# Patient Record
Sex: Male | Born: 1960 | Race: Black or African American | Hispanic: No | Marital: Married | Smoking: Never smoker
Health system: Southern US, Community
[De-identification: ages and names within clinical notes are randomized; demographics above are authoritative.]

## PROBLEM LIST (undated history)

## (undated) DIAGNOSIS — C801 Malignant (primary) neoplasm, unspecified: Secondary | ICD-10-CM

## (undated) DIAGNOSIS — E039 Hypothyroidism, unspecified: Secondary | ICD-10-CM

## (undated) DIAGNOSIS — E119 Type 2 diabetes mellitus without complications: Secondary | ICD-10-CM

## (undated) DIAGNOSIS — I10 Essential (primary) hypertension: Secondary | ICD-10-CM

## (undated) HISTORY — PX: TOTAL THYROIDECTOMY: SHX2547

---

## 2008-03-15 ENCOUNTER — Ambulatory Visit: Payer: Self-pay | Admitting: Vascular Surgery

## 2008-03-15 ENCOUNTER — Inpatient Hospital Stay (HOSPITAL_COMMUNITY): Admission: EM | Admit: 2008-03-15 | Discharge: 2008-03-16 | Payer: Self-pay | Admitting: Emergency Medicine

## 2008-03-15 ENCOUNTER — Encounter (INDEPENDENT_AMBULATORY_CARE_PROVIDER_SITE_OTHER): Payer: Self-pay | Admitting: Internal Medicine

## 2008-03-15 ENCOUNTER — Ambulatory Visit: Payer: Self-pay | Admitting: Cardiology

## 2008-04-18 ENCOUNTER — Encounter (INDEPENDENT_AMBULATORY_CARE_PROVIDER_SITE_OTHER): Payer: Self-pay | Admitting: Interventional Radiology

## 2008-04-18 ENCOUNTER — Other Ambulatory Visit: Admission: RE | Admit: 2008-04-18 | Discharge: 2008-04-18 | Payer: Self-pay | Admitting: Interventional Radiology

## 2008-04-18 ENCOUNTER — Encounter: Admission: RE | Admit: 2008-04-18 | Discharge: 2008-04-18 | Payer: Self-pay | Admitting: *Deleted

## 2008-05-14 IMAGING — US US SOFT TISSUE HEAD/NECK
1 series · 13 of 25 positions shown · non-contrast
Comparison: None

CLINICAL DATA: Evaluate goiter.

THYROID ULTRASOUND
TECHNIQUE: Ultrasound examination of the thyroid gland and adjacent
soft tissues was performed.

[Series 1: unknown · 0.12mm/px · 13 of 35 slices shown]
[im 1/35]
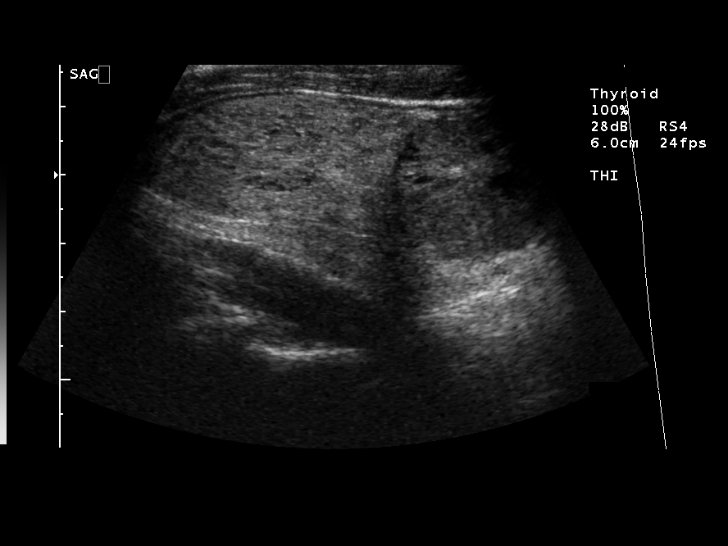
[im 3/35]
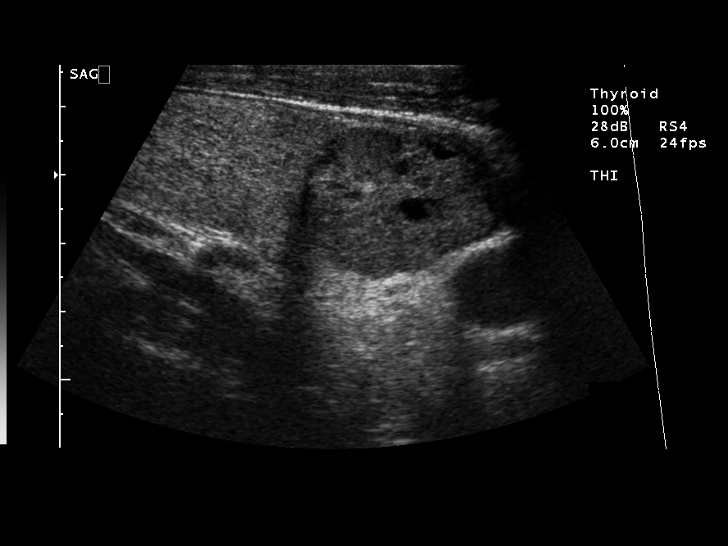
[im 6/35]
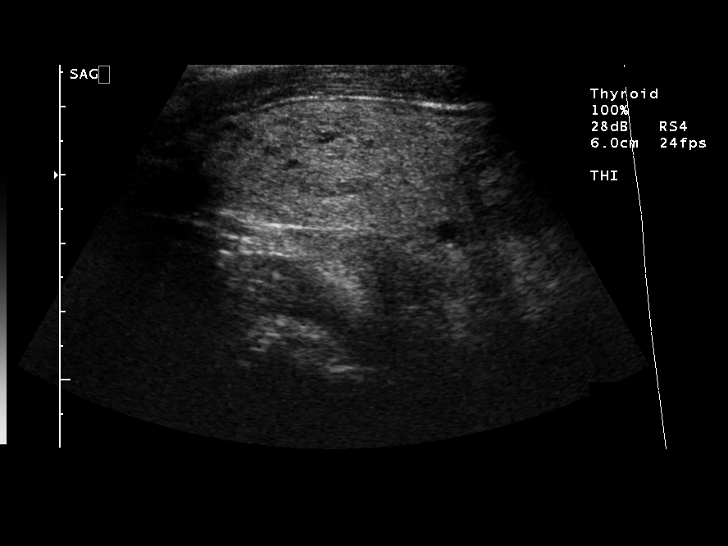
[im 9/35]
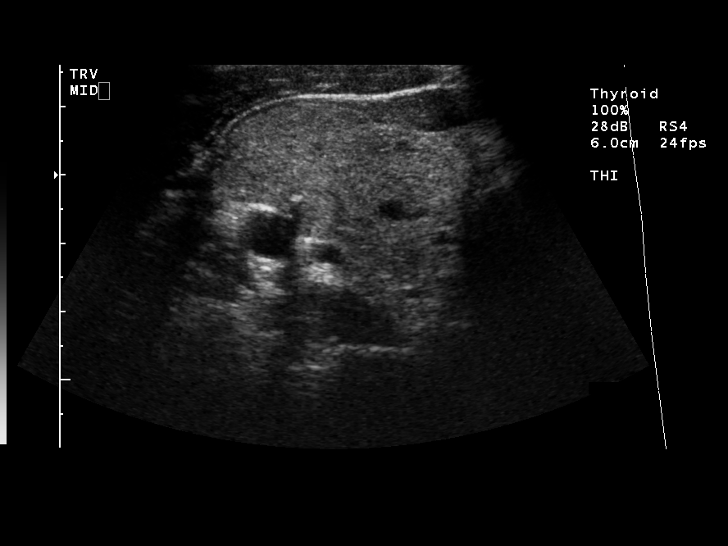
[im 12/35]
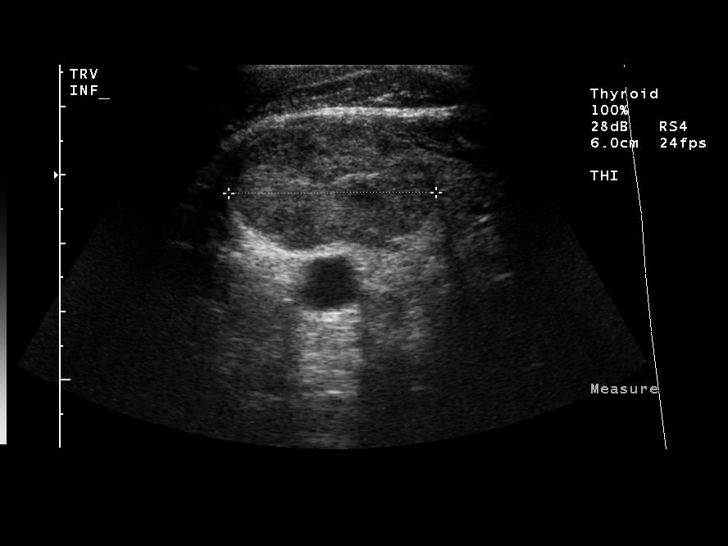
[im 15/35]
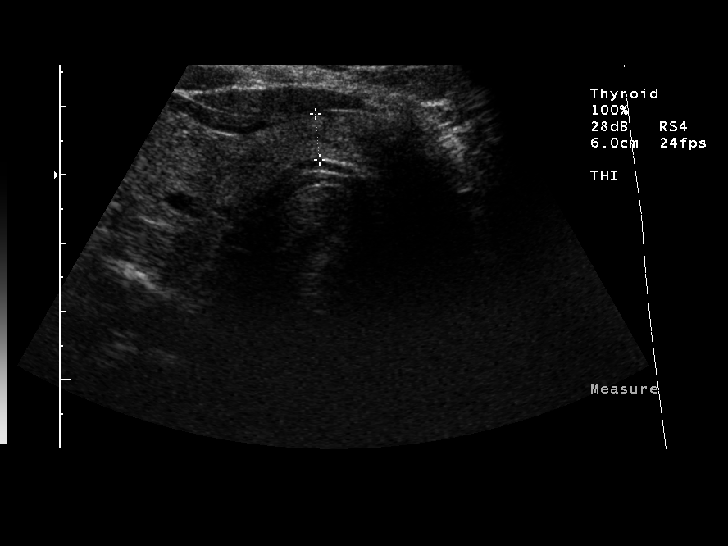
[im 18/35]
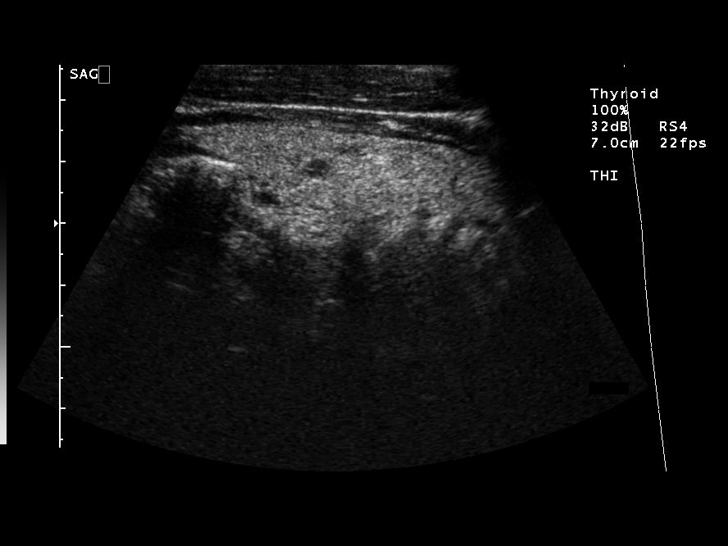
[im 20/35]
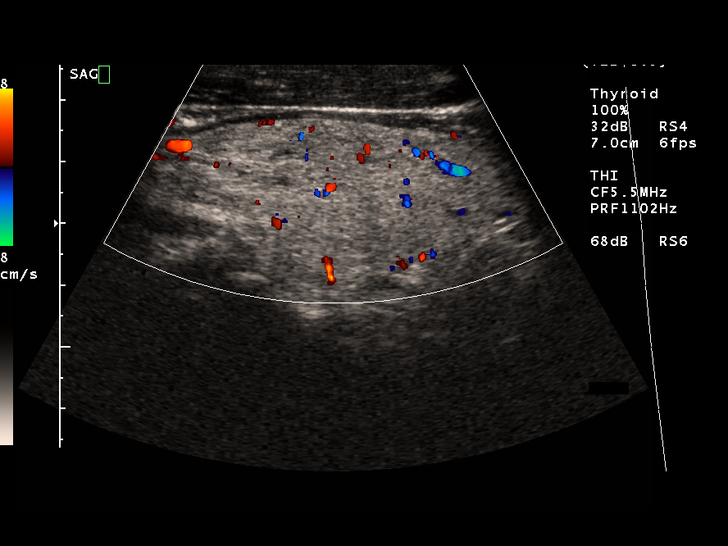
[im 23/35]
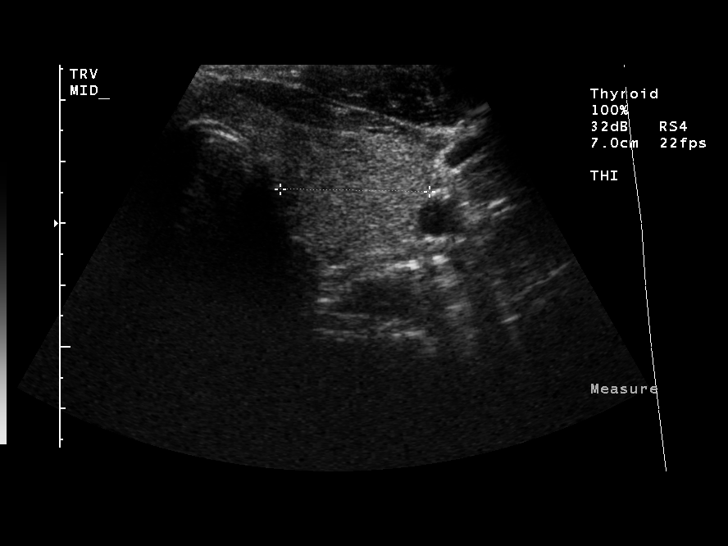
[im 26/35]
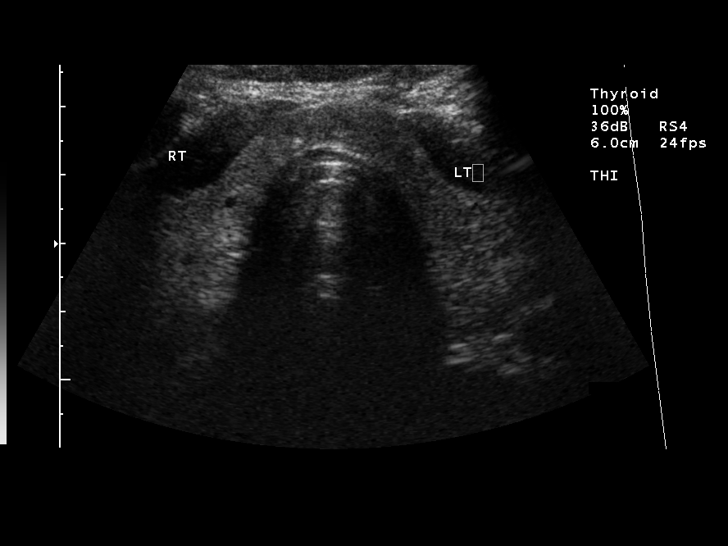
[im 29/35]
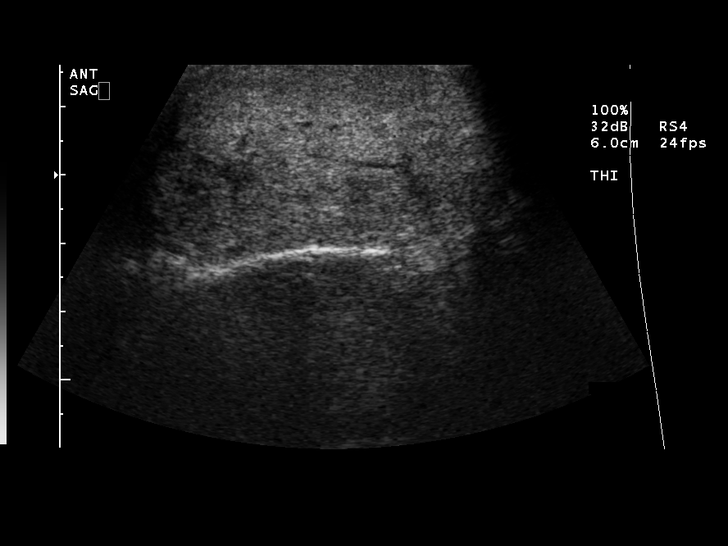
[im 32/35]
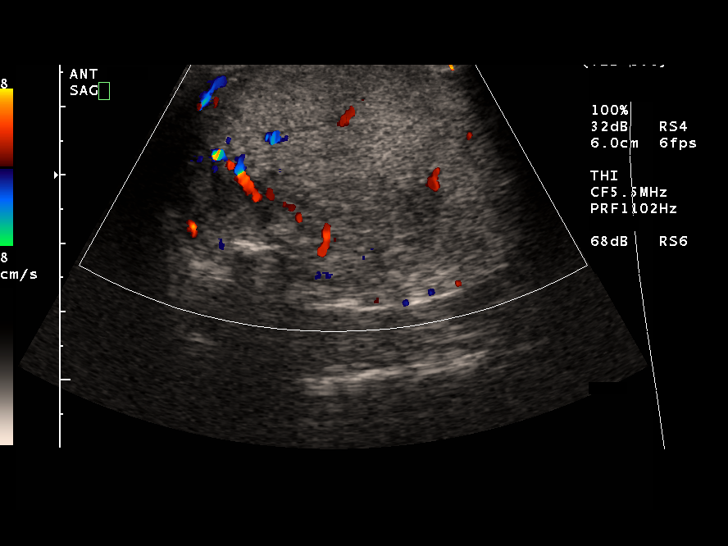
[im 35/35]
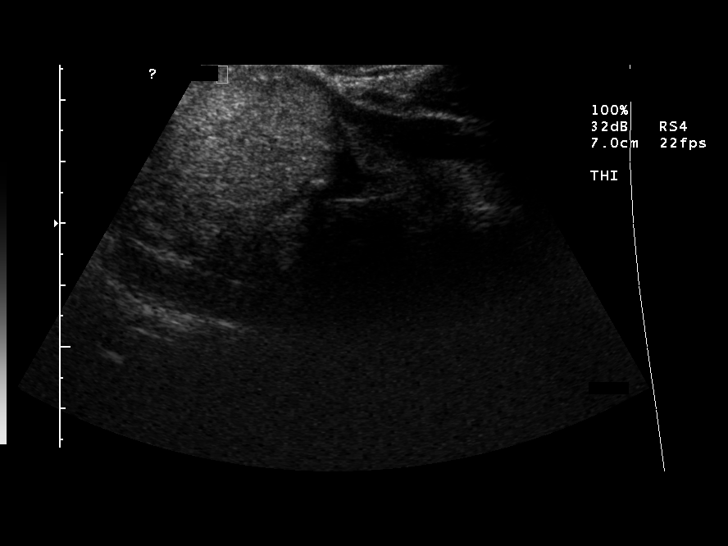

[13 of 25 positions shown; findings below may reference images not displayed]

FINDINGS: The right lobe of the thyroid measured 6.4 x 3.0 x
cm.

The left lobe of the thyroid measured 5.8 x 2.5 x 2.4 cm.

The AP diameter of the isthmus is 0.7 cm.

In the lower pole of the right lobe of the thyroid a predominately
solid dominant mass is seen.  The mass measures 3.0 x 2.2 x 3.0 cm
in diameter. There are some small cystic areas within it.  There is
some internal color Doppler flow]

Next to the superior aspect of the thyroid gland at the junction of
the isthmus and left lobe is a rounded mass density measuring 5.6 x
2.7 x 4.5 cm in diameter with a possible stalk.  This cannot be
separated from the thyroid gland.  I am unsure if this is a mass
arising from the thyroid gland or is a superior extension of the
thyroid gland or a mass contiguous with it.
IMPRESSION: : Mild diffuse enlargement of the right and left lobes of the
thyroid gland consistent with mild thyromegaly.

Predominately solid mass of lower pole right lobe of thyroid with a
few small cystic areas within it..  This is a predominantly solid
mass.  Considering its size ultrasound guided fine needle
aspiration would be suggested to be considered.

The other mass or solid area next to the superior pole of the left
lobe of the thyroid as discussed above.  Biopsies this could also
be performed considered in size.  Further imaging with MRI could be
performed for additional characterization..

## 2008-06-07 ENCOUNTER — Ambulatory Visit (HOSPITAL_COMMUNITY): Admission: RE | Admit: 2008-06-07 | Discharge: 2008-06-08 | Payer: Self-pay | Admitting: Surgery

## 2008-06-07 ENCOUNTER — Encounter (INDEPENDENT_AMBULATORY_CARE_PROVIDER_SITE_OTHER): Payer: Self-pay | Admitting: Surgery

## 2008-06-16 IMAGING — US US BIOPSY
1 series · 13 of 19 positions shown · non-contrast
Comparison: none

CLINICAL DATA: Patient with history of goiter and thyroid
ultrasound at [HOSPITAL] on 03/16/2008 which revealed two
dominant nodules. In the right lower pole there is a predominantly
solid nodule measuring 3.0 x 2.2 x 3.0 cm. In addition , next to
the superior aspect of the thyroid gland at the junction of the
isthmus and left lobe is a nodule measuring 5.6 x 2.7 x 4.5 cm.
Request is now made for needle aspirate biopsies of the above-
mentioned nodules.

ULTRASOUND-GUIDED NEEDLE ASPIRATE BIOPSY, RIGHT LOWER POLE THYROID
NODULE
The above procedures were discussed with the patient and written
informed consent was obtained.

[Series 1: us biopsy · 0.08mm/px · 19 acquisitions, 13 frames shown]
[im 1/19]
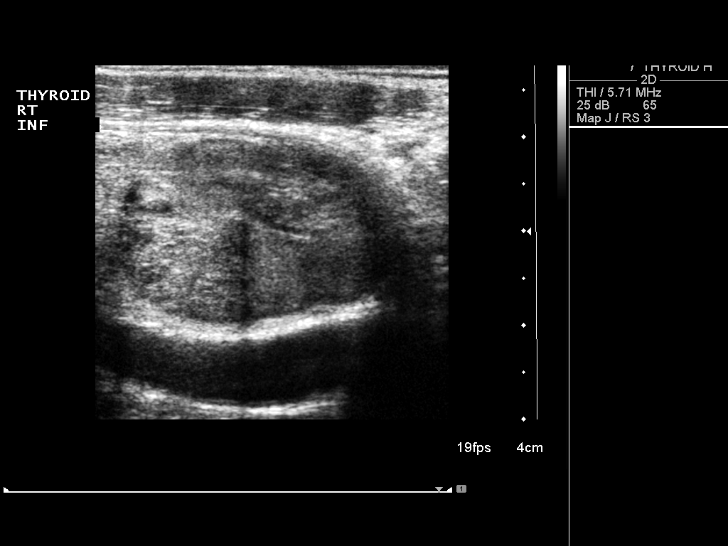
[im 3/19]
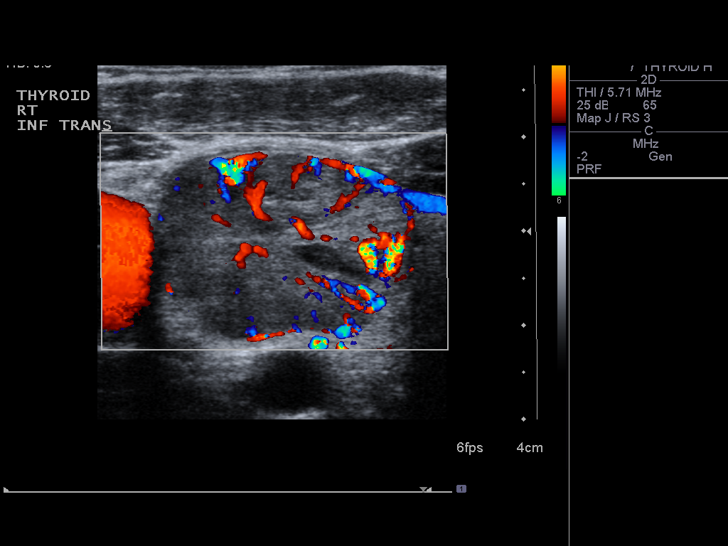
[im 4/19]
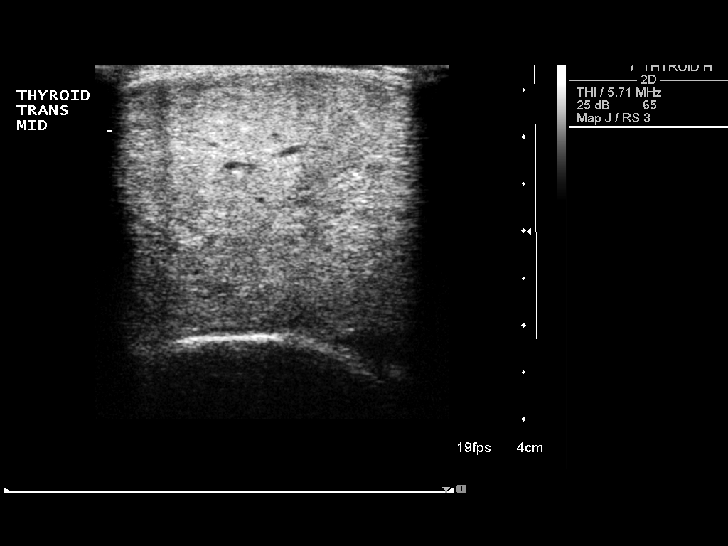
[im 6/19]
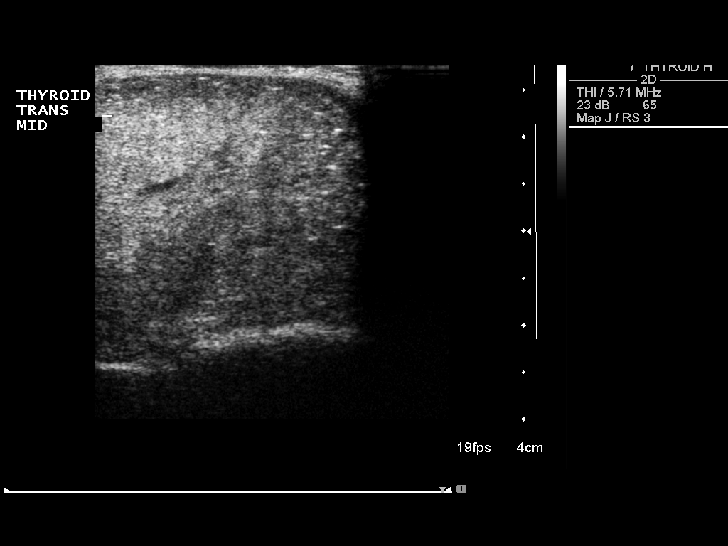
[im 7/19]
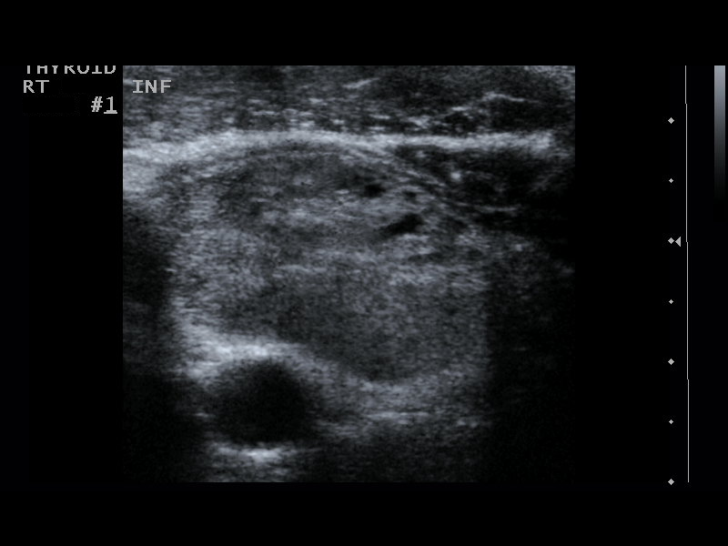
[im 9/19]
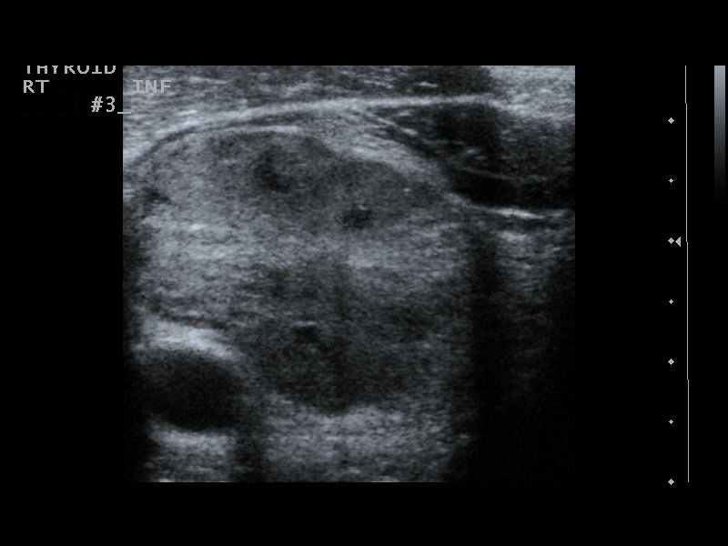
[im 10/19]
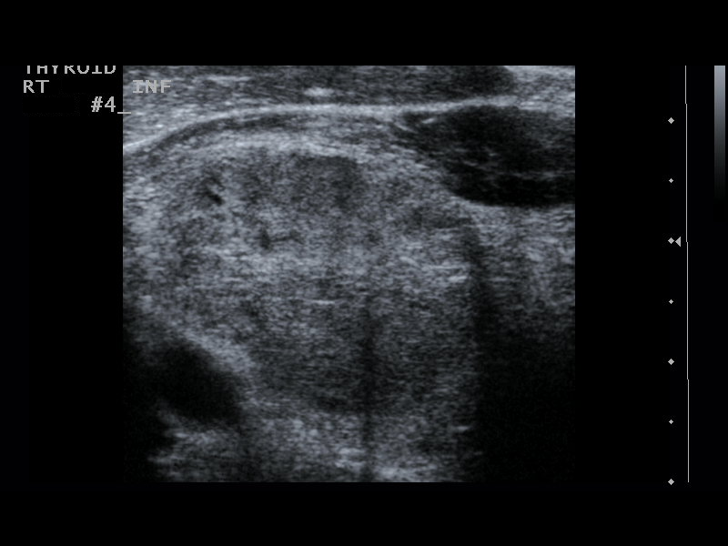
[im 11/19]
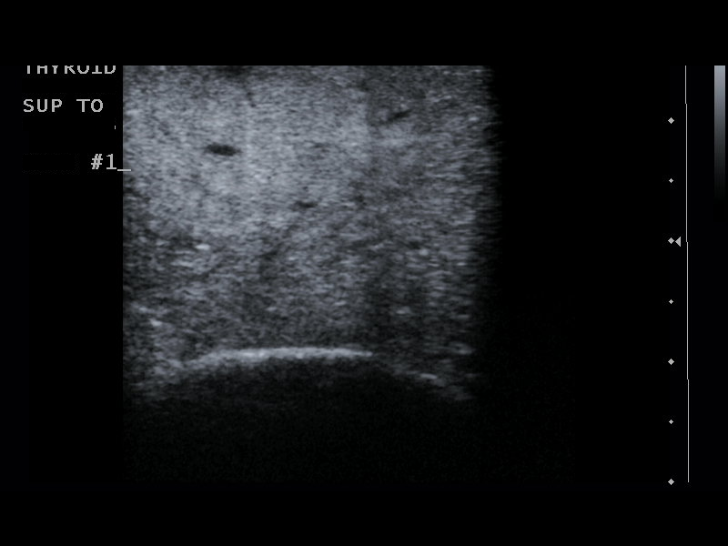
[im 13/19]
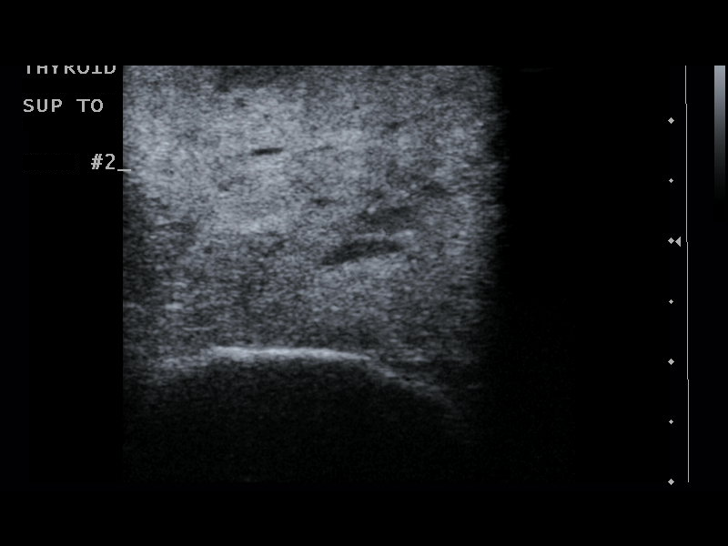
[im 14/19]
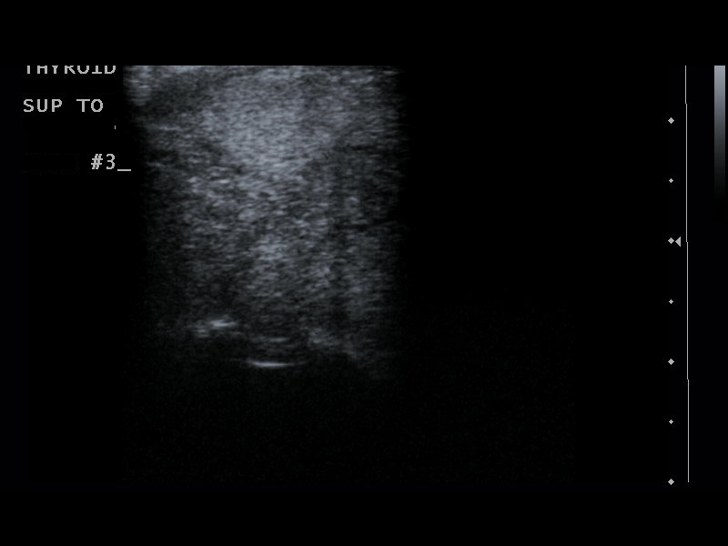
[im 16/19]
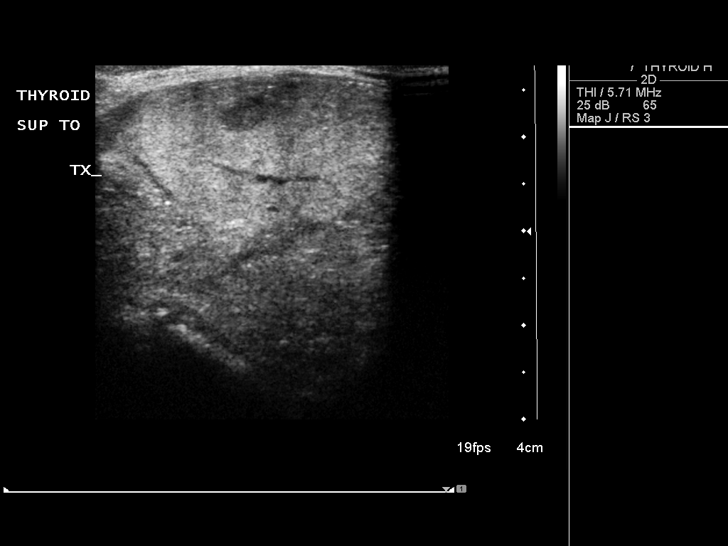
[im 17/19]
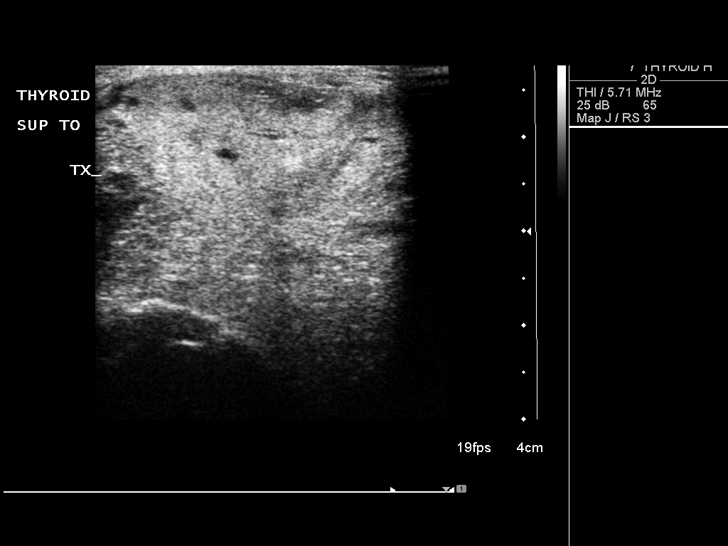
[im 19/19]
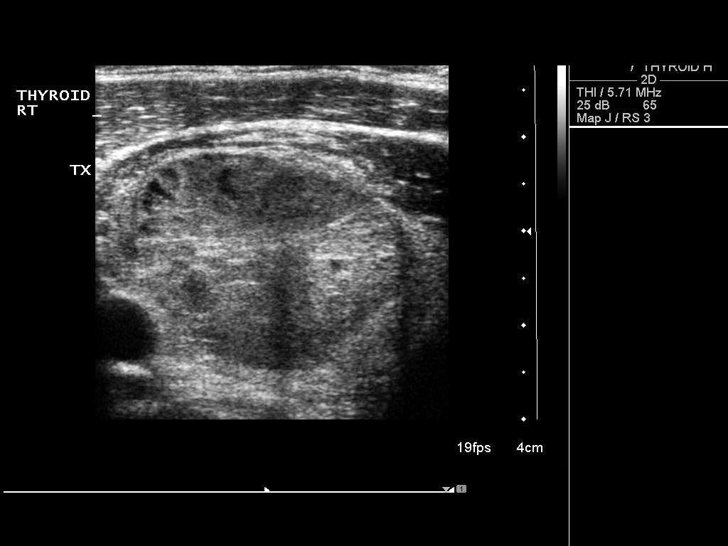

[13 of 19 positions shown; findings below may reference images not displayed]

FINDINGS: Ultrasound was performed to localize and mark an adequate
site for the biopsy. The patient was then prepped and draped in a
normal sterile fashion. Local anesthesia was provided with 1%
lidocaine. Under direct ultrasound guidance, 4 passes were made
using 25 gauge needles into the nodule within the right lower pole
of the thyroid. Ultrasound was used to confirm needle placements
on all four occasions. Specimens were sent to Pathology for
analysis. Post procedural imaging demonstrated no hematoma or
immediate complication. The patient tolerated the procedure well.

ULTRASOUND GUIDED NEEDLE ASPIRATE BIOPSY, SUPERIOR LEFT
THYROID/ISTHMUS JUNCTION NODULE
FINDINGS: Ultrasound was then performed to localize and mark an
adequate site for the biopsy. The patient was then prepped and
draped in a normal sterile fashion. 1% lidocaine was used for
local anesthesia. Under direct ultrasound guidance , 4 passes were
made using 22 and 25 gauge needles into the nodule located within
the superior aspect of the left thyroid/isthmus junction.
Ultrasound confirmed placement of the needle on all four occasions.
The specimens were sent to pathology for analysis. Postprocedural
imaging demonstrated no hematoma or immediate complication. The
patient tolerated procedure well.
IMPRESSION: Successful ultrasound guided needle aspirate biopsies
of a right lower pole and superior left thyroid/isthmus junction
nodules as discussed above. Final pathology pending.

Read by: Martiins, Delfina Da Silva.-MARTIN ALEXIS

## 2008-07-21 ENCOUNTER — Encounter (HOSPITAL_COMMUNITY): Admission: RE | Admit: 2008-07-21 | Discharge: 2008-10-18 | Payer: Self-pay | Admitting: Endocrinology

## 2008-09-25 IMAGING — NM NM RAI THERAPY CANCER THYROID
1 series · 1 of 1 positions shown · non-contrast
Comparison: None

CLINICAL DATA: New thyroid cancer

NUCLEAR MEDICINE RADIOACTIVE IODINE THERAPY FOR CA
TECHNIQUE: The risks and benefits of radioactive iodine therapy
were discussed with the patient in detail. Alternative therapies
were also mentioned. Radiation safety was discussed with the
patient, including how to protect the general public from exposure.
There were no barriers to communication.  Written consent was
obtained.  The patient then received a capsule containing the
radiopharmaceutical.  The patient will follow-up with the referring
physician.
Radiopharmaceutical: 106.4 mCi I131

[Series 1: st statics,dual det. · 1 of 1 slices shown]
[im 1/1]
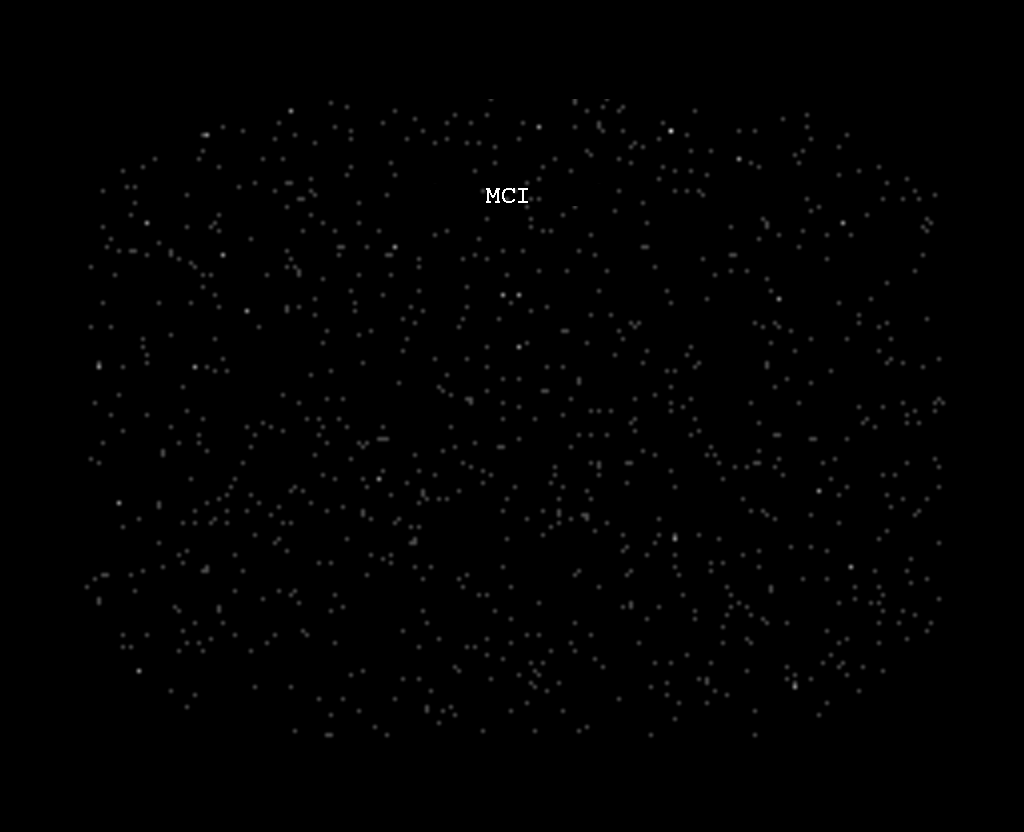

[1 of 1 positions shown; findings below may reference images not displayed]

FINDINGS: No images are taken.
IMPRESSION: The patient had received the capsule containing the
radiopharmaceutical.  The patient will follow up with referring
physician.

## 2010-12-02 ENCOUNTER — Encounter: Payer: Self-pay | Admitting: Endocrinology

## 2011-03-25 NOTE — H&P (Signed)
NAME:  Colton Gibson, Colton Gibson                ACCOUNT NO.:  [id number]   MEDICAL RECORD NO.:  [id number]          PATIENT TYPE:  INP   LOCATION:  4715                         FACILITY:  [facility]   PHYSICIAN:  Hind I Elsaid, MD      DATE OF BIRTH:  March 09, 1961   DATE OF ADMISSION:  03/15/2008  DATE OF DISCHARGE:                              HISTORY & PHYSICAL   CHIEF COMPLAINT:  Chest pain.   HISTORY OF PRESENT ILLNESS:  This is a 50 year old African American male  with a history of hypertension, hyperlipidemia admitted to the hospital  with a chief complaint of chest discomfort, mainly heaviness at the  midsternal area.  The patient went with his regular time at 12:30 a.m.  He woke up with chest discomfort, denies any shortness of breath or  nausea and he felt nauseated at that time.  Denies any vomiting.  Pain  radiated to his left hand and he complained of left hand numbness when  he said he woke up, but the left hand numbness soon gone.  The patient's  chest discomfort is relieved with baby aspirin, but he still has some  kind of pain on his midsternal area.  The patient admitted no similar  problem in the past.   PAST MEDICAL HISTORY:  1. Hypertension.  2. Hypercholesterolemia.   ALLERGIES:  NO KNOWN DRUG ALLERGIES.   PAST SURGICAL HISTORY:  None.   FAMILY HISTORY:  His father died at age 68 secondary to heart attack.  His mother is still alive, has hypertension.   SOCIAL HISTORY:  He is married.  Works as Chartered loss adjuster.  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  Denies  smoking.  Denies any IV drug abuse.  Denies any alcohol abuse.   MEDICATIONS:  Toprol 100 mg p.o. daily.   PHYSICAL EXAMINATION:  VITAL SIGNS:  Temperature 97.9, blood pressure  149/93, pulse rate 72, respiratory rate 20, and satting 97% on room air.  HEENT:  Normocephalic, atraumatic.  Pupils are equal and reactive to  light and accommodation.  Extraocular muscle movement was normal.  NECK:  Supple.  No JVD.  No lymphadenopathy.  LUNG:  Normal vesicular breathing with equal air entry.  ABDOMEN:  Soft, nontender.  Bowel sounds positive.  EXTREMITIES:  There is no lower limb edema.  Peripheral pulses are  intact.  CNS:  The patient is alert and oriented x3, without any focal  neurological finding.   LABORATORY DATA:  Blood work up:  Sodium 15, hemoglobin 16.6, hematocrit  46.  Sodium 41, potassium 3.9, chloride at 105, glucose 90, BUN 13,  creatinine 1.  Cardiac markers, CK-MB 11.7, myoglobin 221, and troponin  0.005.  Chest x-ray is pending.  EKG, normal sinus rhythm with a heart  rate around 61 with possible left atrial enlargement.   ASSESSMENT AND PLAN:  1. Chest pain to rule out myocardial infarction, rule out pulmonary      embolism, which is unlikely.  We will get D-dimmer, serial cardiac      enzymes, and a 2D echo.  We will place the patient on aspirin and      beta-blocker.  We will get lipid profile.  2. Hypertension.  We will continue with Toprol.  3. Deep venous thrombosis and gastrointestinal prophylaxis.  Further      recommendation to be addressed as hospital course progress.      Hind Bosie Helper, MD  Electronically Signed     HIE/MEDQ  D:  03/15/2008  T:  03/15/2008  Job:  [id number]

## 2011-03-25 NOTE — Op Note (Signed)
NAME:  Colton Gibson, Colton Gibson                ACCOUNT NO.:  [id number]   MEDICAL RECORD NO.:  [id number]          PATIENT TYPE:  AMB   LOCATION:  DAY                          FACILITY:  [facility]   PHYSICIAN:  Ardeth Sportsman, MD     DATE OF BIRTH:  1961-05-05   DATE OF PROCEDURE:  DATE OF DISCHARGE:                               OPERATIVE REPORT   PRIMARY CARE PHYSICIAN:  Gabriel Earing, M.D.   REQUESTING PHYSICIAN:  Rosine Abe, M.D.   SURGEON:  Ardeth Sportsman, M.D.   ASSISTANT:  Almond Lint, M.D.   PREOPERATIVE DIAGNOSIS:  1. Right upper pole thyroid nodule, FNA biopsy consistent with      follicular neoplasm.  2. Left isthmus thyroid nodule, FNA biopsy consistent with follicular      neoplasm.   POSTOPERATIVE DIAGNOSIS:  1. Right upper lobe thyroid mass, preoperative FNA consistent with      follicular neoplasm.  2. 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   PROCEDURE PERFORMED:  Total thyroidectomy with excision of large  probable pyramidal lobe.   ANESTHESIA:  1. General anesthesia.  2. Local anesthetic with field block around all port sites.   ESTIMATED BLOOD LOSS:  150 ml.   COMPLICATIONS:  No major complications.   INDICATIONS:  Mr. Hendriks is a 51 year old male who has had an engorging  neck fullness over the past few years.  He underwent ultrasound and was  found to have nodules in his right upper lobe and at the left side of  his isthmus as well.  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  Because these masses looked larger than 2 cm, (right greater  than 3 cm, and isthmus greater than 5 cm), I recommended that these be  excised.  Risks, benefits and alternatives were discussed.  Technique of  total thyroidectomy was discussed.  The risks of injury to the organs  and nerve injury and other risks were discussed in detail.  Questions  answered, agreed to proceed.   OPERATIVE FINDINGS:  He had a very large midline structure superior to  the isthmus of his thyroid, most likely consistent with an enlarged  pyramidal lobe.  This is most consistent with a lesion that was called  by ultrasound a lesion off the left isthmus.  His right upper pole  nodule was within the thyroid itself.  There was no other aberrant  anatomy.   DESCRIPTION OF PROCEDURE:  Informed consent was confirmed.  The patient  received preoperative antibiotics just prior to surgery.  He voided just  prior to going to the operating room.  He underwent general anesthesia  without any difficulty.  He was positioned supine with a shoulder roll  and gentle neck extension.  His chin to nipples were prepped and draped  in a sterile fashion.  A 6 cm curvilinear transverse incision was made  two fingerbreadths above the clavicular heads, centered around the  midline.  Later this was  extended around 7 cm total.  Cautery was used  to get through the platysma muscles without difficulty.  He had some  large anterior jugular veins, and these were able to be ligated and  dissected in the area.   We encountered a midline thyroid nodule.  After lifting up the platysmal  muscles and creating superior and inferior platysmal flaps, using  primary blunt as well as controlled cautery/dissection, was able to  encounter the flap muscles.  Inferiorly, the muscles had been pushed off  midline to the side secondary to the bulging of the central mass.  The  strap muscles are reflected laterally.  I was able to help free the  large midline structure off the strap muscles.  Initial concern this is  perhaps the enlarged isthmus within the thyroid.  We stayed closely  adherent to the mass and were able to free around it circumferentially  using controlled cautery.  Occasionally used clips and ties on the  larger blood vessels.  We used harmonic in most cases, especially as we  got  more posteriorly.  This mass is definitely adherent around the  inferior part of his very large thyroid cartilage.  We were superior to  his cricoid.  It kind of came to a point, barely attached to the  isthmus, most consistent with an enlarged pyrimidal lobe.  It was freed  off with a harmonic.  We sent the specimen off to Dr. Jimmy Picket with  pathology.  He felt on frozen evaluation that it was consistent with  thyroid tissue and possible follicular neoplasm, favoring adenoma but  obviously cannot make that full call on frozen alone.   Further inspection of the neck with the large mass revealed that his  thyroid glands were still noted.  His lobes were very posterior with a  wide, narrow isthmus.  We went ahead and did dissection on the left  side.  Meticulous dissection was done to free the thyroid gland off the  strap muscles all the way down the edge using primary blunt dissection  as well as some focused ultrasonic and cautery dissection.  We avoided  cautery around the posterior wall of the thyroid during the entire case.  In doing dissection, we were able to get around the superior pole of the  thyroid more easily.  We were able to free off the lateral part of the  superior lobe and follow it up until it came to a tip.  I was able to  get a window between the medial part of the superior portal of the  thyroid gland and the laryngeal cartilage and some cricoid.  This was  done primarily with some blunt dissection as well as focused harmonic as  well.  I eventually came to the tip of the superior pole and gradually  ligated the superior pole vessels individually using harmonic on the  specimen side and 2-0 silk ties on the neck side.  A good candidate for  a left superior parathyroid pole was seen on the posterior aspect of the  superior lobe of the thyroid, and this was freed off from its apex and  allowed to peel down, preserving its blood supply.  We were able to come  over and  isolate and ligate the middle thyroid vein using silk tie on  the neck side and harmonic on the specimen side, staying close on the  thyroid as well.  Further dissection was done to help free the inferior  pole.  The best candidate for the inferior parathyroid was densely  adherent to the thyroid gland and extremely small, and it could not be  safely spared; however, when we reinspected it more around the inferior  artery, a little more on the neck side, there seemed to be other tissue  that was likely to be a parathyroid gland as well.  That was easily  preserved.  I gradually was able to take the inferior thyroid vessels  using primary ultrasonic dissection and some occasional clips as well,  staying close on the gland.  This helped free up the left lobe to allow  it to reflect up towards the isthmus.  I feel confident that I stayed  away from the rrecurrnet laryngeal nerve.  We were able to reflect the  thyroid lobe more anteriorly.  Eventually, we got down near the ligament  of Allyson Sabal and was able to free this off through gentle spreads and do  harmonic ultrasonic dissection to help free off the isthmus over towards  the right side.   Attention was turned towards the right side.  Dissection was carried out  in a mirror-image fashion.  Of note, I can palpate the right upper lobe  nodule.  It was smooth but firm.  It was within the thyroid gland  itself.  I could see an excellent candidate for a right inferior  parathyroid gland.  This was preserved.  I could see a good candidate  for the right superior parathyroid gland, and that was preserved as  well.  Eventually, we were able to free it off and get the thyroid gland  out.  I placed a stitch in the superior aspect on the right superior  pole.  As, I was working on the right side, the left wound had been  packed all that time for good hemostasis.   Copious irrigation was done on both sides, and hemostasis was assured  primarily with  clips, unless there was obviously muscle on the strap  muscles, and then cautery was safely used.  I went ahead and placed  Surgicel in the wounds.  I packed it and held pressure for several  minutes and reinspected.  The strap muscles were closed together  vertically using interrupted 3-0 Vicryl stitches.  The platysma was  closed with interrupted 3-0 Vicryl stitches as well but in a transverse  fashion.  The skin was closed using a running 4-0 Monocryl stitch with  the tails brought out at the end and full length Steri-Strips covering  on the sides and half-cut Steri-Strips covering perpendicularly.  A  sterile dressing is applied.  Patient is extubated and sent to the  recovery room in stable condition.   I explained the operative findings to the patient's family.      Ardeth Sportsman, MD  Electronically Signed     SCG/MEDQ  D:  06/07/2008  T:  06/07/2008  Job:  [id number]   cc:   Gabriel Earing, M.D.  Fax: [phone]   Elmore Guise., M.D.  Fax: [phone]

## 2011-03-25 NOTE — Discharge Summary (Signed)
NAME:  Colton Gibson, Colton Gibson                ACCOUNT NO.:  [id number]   MEDICAL RECORD NO.:  [id number]          PATIENT TYPE:  INP   LOCATION:  4715                         FACILITY:  [facility]   PHYSICIAN:  Elliot Cousin, M.D.    DATE OF BIRTH:  01-30-1961   DATE OF ADMISSION:  03/15/2008  DATE OF DISCHARGE:  03/16/2008                               DISCHARGE SUMMARY   DISCHARGE DIAGNOSES:  1. Chest pain.  Myocardial infarction ruled out.  Normal coronary      arteries per cardiac catheterization.  2. Hypertension.  3. Bradycardia, thought to be secondary to Toprol.  4. Mild dyslipidemia.  5. Thyroid enlargement/goiter.   DISCHARGE MEDICATIONS:  1. Toprol XL 100 mg half a tablet b.i.d.  2. Aspirin 81 mg daily.   DISCHARGE DISPOSITION:  The plan is to discharge the patient home this  afternoon if he is medically stable.  He is advised to follow up with  Dr. Lady Deutscher in 2 weeks and Dr. Earlene Plater Cloward in 1-2 weeks.   PROCEDURES PERFORMED:  1. Ultrasound of the neck.  Results are pending.  2. Cardiac catheterization performed on Mar 16, 2008, by Dr. Lady Deutscher.  The preliminary results revealed normal-appearing coronary      arteries, hyperdynamic left ventricle with an ejection fraction of      65%, and normal-appearing aortic root.  3. A 2-D echocardiogram performed on Mar 15, 2008.  The results      revealed an ejection fraction of 55%-60%.  Left ventricular wall      thickness was mildly increased.  The left atrium was mildly to      moderately dilated.  4. Venous doppler of the lower extremites were negative for DVT.   CONSULTATIONS:  Cardiologist Dr. Patty Sermons and Dr. Reyes Ivan.   HISTORY OF PRESENT ILLNESS:  The patient is a 50 year old man with a  past medical history significant for hypertension who presented to the  emergency department on Mar 15, 2008, with a chief complaint of chest  pain.  When the patient was evaluated in the emergency department, his  EKG revealed  normal sinus rhythm with a heart rate of 61 beats per  minute and no acute abnormalities.  His initial cardiac markers were  negative.  However, because of his cardiac risk factors, he was admitted  for further evaluation and management.   For additional details, please see the dictated history and physical.   HOSPITAL COURSE:  1. CHEST PAIN, HYPERTENSION, BRADYCARDIA, AND MILD DYSLIPIDEMIA.  The      patient was started on aspirin therapy at 325 mg daily.  His pain      was treated with morphine and as needed nitroglycerin.      Subsequently, nitro paste was added at third of an inch every 6      hours.  Beta blockade therapy was continued with Toprol XL.  For      further evaluation, cardiac enzymes, a 2-D echocardiogram, fasting      lipid panel, and a TSH were ordered.  His creatine kinase was  elevated ranging from 450-850.  However, the relative index was      within normal limits.  All of his troponin Is were well within      normal limits.  His TSH was within normal limits at 0.965.  His BNP      was within normal limits at less than 30.  His total cholesterol      was 173, triglycerides 97, HDL 28, and LDL 126.  D-dimer was also      assessed and found to be marginally elevated 0.51.  The suspicion      for PE was low.  However, bilateral lower extremity Dopplers were      ordered and were negative for DVT.  His 2-D echocardiogram revealed      an ejection fraction of 55%-65% and mild-to-moderate left atrial      dilatation.   Given the patient's risk factors, cardiology was consulted.  Dr.  Patty Sermons provided the initial consultation and felt that the patient  needed further diagnostic workup with a cardiac catheterization.  He  believed that a Myoview would not be diagnostic, given the patient's  body habitus.  Today, the patient underwent a cardiac catheterization as  performed by Dr. Lady Deutscher.  As indicated above, the results revealed  that the patient had normal  coronary arteries.  The patient is currently  stable and chest pain free.   Over the course of the hospitalization, the patient's blood pressure  remained controlled.  However, his heart rate did fall into the 40s and  50s periodically.  The bradycardia was felt to be secondary to Toprol.  A trial of splitting the Toprol dose to 50 mg b.i.d. appeared to help.  Therefore, the patient was advised to continue Toprol XL 100 mg daily,  but to take half a pill in the morning and half a pill in the evening.  The patient voiced understanding.  In addition, Dr. Reyes Ivan considered  adding Niaspan, however, he will wait until he follows up with the  patient in the outpatient setting.  1. THYROID GOITER/THYROMEGALY.  Dr. Reyes Ivan noticed that the patient      had thyroid enlargement.  The patient's TSH was assessed and found      to be within normal limits at 0.965.  An ultrasound of the neck has      been ordered and the results are currently pending. The patient was      advised to follow up with Dr. Reyes Ivan or Dr. Andi Devon for the results      and further management if needed.      Elliot Cousin, M.D.  Electronically Signed     DF/MEDQ  D:  03/16/2008  T:  03/17/2008  Job:  [id number]   cc:   Gabriel Earing, M.D.  Elmore Guise., M.D.

## 2011-03-25 NOTE — Consult Note (Signed)
NAME:  Colton Gibson, Colton Gibson                ACCOUNT NO.:  [id number]   MEDICAL RECORD NO.:  [id number]          PATIENT TYPE:  INP   LOCATION:  4715                         FACILITY:  [facility]   PHYSICIAN:  Cassell Clement, M.D. DATE OF BIRTH:  1961-08-08   DATE OF CONSULTATION:  DATE OF DISCHARGE:                                 CONSULTATION   HISTORY:  This is a 50 year old African American gentleman admitted  earlier this morning after awakening with substernal chest pressure,  left arm weakness and numbness, and his wife described him as being  clammy and diaphoretic.  He also felt weak and somewhat disoriented when  he woke up.  He was slightly nauseous and thought he was going to vomit,  but did not vomit.  His wife called 911 and was told to give him 4 baby  aspirin, which she did.  EMS arrived and assessed him and advised him to  come to the emergency room.  The patient does not have any prior history  of known coronary artery disease.  He does have a prior history of high  blood pressure for many years and he has been under treatment for about  the past 3 years by Dr. Andi Devon at [facility] and has been on Toprol.  He does not have any history of diabetes, but his A1c on admission is  6.3.  He has had chronic obesity with a weight presently at 339.   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  He  has also been a visiting professor of religion at [employer]  and has been making the 500-mile round trip several times a week.  He is  married.  He has 2 teenagers.  He does not use alcohol or tobacco.  He  does drink diet sodas, which are caffeinated   ALLERGIES:  NONE KNOWN.   OPERATIONS:  None.   HOSPITALIZATIONS:  None.   REVIEW OF SYSTEMS:  Reveals he has had occasional loose stools.  No  history of peptic ulcer.  No genitourinary complaints.  No cough or  sputum production.   FAMILY HISTORY:  Reveals that his father started having heart attacks  in  his late 16s and died of heart attack at age 36.  The patient's mother  is living, but has had heart failure.   PHYSICAL EXAMINATION:  VITAL SIGNS:  Blood pressure is 126/86, pulse 56  regular, respirations are normal.  GENERAL APPEARANCE:  Reveals a very large male in no acute distress,  lying flat comfortably.  SKIN:  Warm and dry.  HEENT:  Unremarkable.  NECK:  Carotids normal, jugular venous pressure normal, and thyroid  normal.  CHEST:  Clear to percussion and auscultation.  HEART:  Reveals no murmur, gallop, rub, or click.  ABDOMEN:  Obese without hepatosplenomegaly or masses.  EXTREMITIES:  Show good peripheral pulses, no edema, no phlebitis.   His electrocardiogram shows normal sinus rhythm and no acute changes.  Chest x-ray shows borderline cardiomegaly and clear lung fields.  Two-  dimensional echocardiogram shows left ventricular hypertrophy and normal  LV systolic function.  Blood workup of noted total CK of 859 with an MB  of 20 and a troponin of 0.2 followed by a CK of 647, MB of 13.9,  troponin of 0.03.  The patient denies any unusual physical exertion to  account for the high total CK.  The patient has a cholesterol of 173 and  a HDL is low at 28.   IMPRESSION:  Possible acute coronary artery syndrome manifested with  awakening with substernal chest pressure with radiation to the left arm  and associated with nausea and diaphoresis and elevated CK-MBs, but  normal troponins.   RECOMMENDATIONS:  For further evaluation, I do not think Myoview would  be that reliable in view of his very large body habitus.  Therefore, we  will proceed with diagnostic cardiac catheterization with Dr. Lady Deutscher on Thursday morning.  We will plan to keep him n.p.o. after  midnight tonight.   Thank you for the opportunity to see this pleasant gentleman with you.           ______________________________  Cassell Clement, M.D.     TB/MEDQ  D:  03/15/2008  T:  03/16/2008   Job:  [id number]   cc:   Elmore Guise., M.D.  Gabriel Earing, M.D.  Elliot Cousin, M.D.

## 2011-08-08 LAB — BASIC METABOLIC PANEL
Calcium: 9.2
GFR calc Af Amer: >60
GFR calc non Af Amer: >60
Sodium: 140

## 2011-08-08 LAB — CALCIUM: Calcium: 8.7

## 2011-08-08 LAB — HEMOGLOBIN AND HEMATOCRIT, BLOOD: HCT: 44.6

## 2016-02-18 ENCOUNTER — Other Ambulatory Visit: Payer: Self-pay | Admitting: Gastroenterology

## 2016-03-17 ENCOUNTER — Encounter (HOSPITAL_COMMUNITY): Payer: Self-pay | Admitting: *Deleted

## 2016-03-24 ENCOUNTER — Ambulatory Visit (HOSPITAL_COMMUNITY)
Admission: RE | Admit: 2016-03-24 | Discharge: 2016-03-24 | Disposition: A | Discharge status: Home / Self Care | Payer: BLUE CROSS/BLUE SHIELD | Source: Ambulatory Visit | Attending: Gastroenterology | Admitting: Gastroenterology

## 2016-03-24 ENCOUNTER — Encounter (HOSPITAL_COMMUNITY): Payer: Self-pay | Admitting: *Deleted

## 2016-03-24 ENCOUNTER — Ambulatory Visit (HOSPITAL_COMMUNITY): Payer: BLUE CROSS/BLUE SHIELD | Admitting: Anesthesiology

## 2016-03-24 ENCOUNTER — Encounter (HOSPITAL_COMMUNITY)
Admission: RE | Disposition: A | Discharge status: Home / Self Care | Payer: Self-pay | Source: Ambulatory Visit | Attending: Gastroenterology

## 2016-03-24 DIAGNOSIS — K5790 Diverticulosis of intestine, part unspecified, without perforation or abscess without bleeding: Secondary | ICD-10-CM | POA: Diagnosis not present

## 2016-03-24 DIAGNOSIS — Z1211 Encounter for screening for malignant neoplasm of colon: Secondary | ICD-10-CM | POA: Insufficient documentation

## 2016-03-24 DIAGNOSIS — Y836 Removal of other organ (partial) (total) as the cause of abnormal reaction of the patient, or of later complication, without mention of misadventure at the time of the procedure: Secondary | ICD-10-CM | POA: Insufficient documentation

## 2016-03-24 DIAGNOSIS — I10 Essential (primary) hypertension: Secondary | ICD-10-CM | POA: Insufficient documentation

## 2016-03-24 DIAGNOSIS — E890 Postprocedural hypothyroidism: Secondary | ICD-10-CM | POA: Diagnosis not present

## 2016-03-24 DIAGNOSIS — E119 Type 2 diabetes mellitus without complications: Secondary | ICD-10-CM | POA: Diagnosis not present

## 2016-03-24 DIAGNOSIS — Z85850 Personal history of malignant neoplasm of thyroid: Secondary | ICD-10-CM | POA: Diagnosis not present

## 2016-03-24 DIAGNOSIS — Z6841 Body Mass Index (BMI) 40.0 and over, adult: Secondary | ICD-10-CM | POA: Insufficient documentation

## 2016-03-24 HISTORY — PX: COLONOSCOPY WITH PROPOFOL: SHX5780

## 2016-03-24 HISTORY — DX: Type 2 diabetes mellitus without complications: E11.9

## 2016-03-24 HISTORY — DX: Malignant (primary) neoplasm, unspecified: C80.1

## 2016-03-24 HISTORY — DX: Hypothyroidism, unspecified: E03.9

## 2016-03-24 HISTORY — DX: Essential (primary) hypertension: I10

## 2016-03-24 SURGERY — COLONOSCOPY WITH PROPOFOL
Anesthesia: Monitor Anesthesia Care

## 2016-03-24 MED ORDER — PROPOFOL 10 MG/ML IV BOLUS
INTRAVENOUS | Status: AC
  Filled 2016-03-24: qty 40

## 2016-03-24 MED ORDER — PROPOFOL 500 MG/50ML IV EMUL
INTRAVENOUS | Status: DC | PRN
  Administered 2016-03-24: 150 ug/kg/min via INTRAVENOUS

## 2016-03-24 MED ORDER — LACTATED RINGERS IV SOLN
INTRAVENOUS | Status: DC
  Administered 2016-03-24: 1000 mL via INTRAVENOUS
  Administered 2016-03-24: 10:00:00 via INTRAVENOUS

## 2016-03-24 MED ORDER — SODIUM CHLORIDE 0.9 % IV SOLN: INTRAVENOUS | Status: DC

## 2016-03-24 MED ORDER — PROPOFOL 500 MG/50ML IV EMUL
INTRAVENOUS | Status: DC | PRN
  Administered 2016-03-24: 40 mg via INTRAVENOUS

## 2016-03-24 MED ORDER — PROPOFOL 10 MG/ML IV BOLUS
INTRAVENOUS | Status: AC
  Filled 2016-03-24: qty 20

## 2016-03-24 SURGICAL SUPPLY — 22 items

## 2016-03-24 NOTE — Discharge Instructions (Signed)

## 2016-03-24 NOTE — H&P (Signed)
  Procedure: Baseline screening colonoscopy  History: The patient is a 55 year old male born 08-Feb-1961. He is scheduled to undergo his first screening colonoscopy.  Medication allergies: None  Family history: Negative for colon cancer  Past medical history: Hypertension. 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 BMI 49.2  Exam: The patient is alert and lying comfortably on the endoscopy stretcher. Abdomen is soft and nontender to palpation. Cardiac exam reveals a regular rhythm. Lungs are clear to auscultation.  Plan: Proceed with baseline screening colonoscopy

## 2016-03-24 NOTE — Op Note (Signed)
[facility] Patient Name: Colton Gibson Procedure Date: 03/24/2016 MRN: [id number] Attending MD: Garlan Fair , MD Date of Birth: May 12, 1961 CSN: [id number] Age: 55 Admit Type: Outpatient Procedure:                Colonoscopy Indications:              Screening for colorectal malignant neoplasm Providers:                Garlan Fair, MD, Laverta Baltimore, RN,                            Vista Lawman, RN, Arnoldo Hooker, CRNA Referring MD:              Medicines:                Propofol per Anesthesia Complications:            No immediate complications. Estimated Blood Loss:     Estimated blood loss: none. Procedure:                Pre-Anesthesia Assessment:                           - Prior to the procedure, a History and Physical                            was performed, and patient medications and                            allergies were reviewed. The patient's tolerance of                            previous anesthesia was also reviewed. The risks                            and benefits of the procedure and the sedation                            options and risks were discussed with the patient.                            All questions were answered, and informed consent                            was obtained. Prior Anticoagulants: The patient has                            taken no previous anticoagulant or antiplatelet                            agents. ASA Grade Assessment: II - A patient with                            mild systemic disease. After reviewing the risks  and benefits, the patient was deemed in                            satisfactory condition to undergo the procedure.                           After obtaining informed consent, the colonoscope                            was passed under direct vision. Throughout the                            procedure, the patient's blood pressure, pulse, and       oxygen saturations were monitored continuously. The                            Colonoscope was introduced through the anus and                            advanced to the the cecum, identified by                            appendiceal orifice and ileocecal valve. The                            colonoscopy was performed without difficulty. The                            patient tolerated the procedure well. The quality                            of the bowel preparation was good. The terminal                            ileum, the ileocecal valve, the appendiceal orifice                            and the rectum were photographed. Scope In: 9:57:49 AM Scope Out: 10:11:45 AM Scope Withdrawal Time: 0 hours 10 minutes 13 seconds  Total Procedure Duration: 0 hours 13 minutes 56 seconds  Findings:      The perianal and digital rectal examinations were normal.      The entire examined colon appeared normal. Impression:               - The entire examined colon is normal.                           - No specimens collected. Moderate Sedation:      N/A- Per Anesthesia Care Recommendation:           - Patient has a contact number available for                            emergencies. The signs and symptoms of potential  delayed complications were discussed with the                            patient. Return to normal activities tomorrow.                            Written discharge instructions were provided to the                            patient.                           - Repeat colonoscopy in 10 years for screening                            purposes.                           - Resume previous diet.                           - Continue present medications. Procedure Code(s):        --- Professional ---                           [phone], Colonoscopy, flexible; diagnostic, including                            collection of specimen(s) by brushing or washing,                             when performed (separate procedure) Diagnosis Code(s):        --- Professional ---                           Z12.11, Encounter for screening for malignant                            neoplasm of colon CPT copyright 2016 [employer]. All rights reserved. The codes documented in this report are preliminary and upon coder review may  be revised to meet current compliance requirements. Earle Gell, MD Garlan Fair, MD 03/24/2016 10:16:35 AM This report has been signed electronically. Number of Addenda: 0

## 2016-03-24 NOTE — Anesthesia Postprocedure Evaluation (Signed)
Anesthesia Post Note  Patient: Colton Gibson  Procedure(s) Performed: Procedure(s) (LRB): COLONOSCOPY WITH PROPOFOL (N/A)  Patient location during evaluation: Endoscopy Anesthesia Type: MAC Level of consciousness: awake and alert Pain management: pain level controlled Vital Signs Assessment: post-procedure vital signs reviewed and stable Respiratory status: spontaneous breathing, nonlabored ventilation, respiratory function stable and patient connected to nasal cannula oxygen Cardiovascular status: stable and blood pressure returned to baseline Anesthetic complications: no    Last Vitals:  Filed Vitals:   03/24/16 1040 03/24/16 1050  BP: 139/68 140/61  Pulse: 62 57  Temp:    Resp: 16 12    Last Pain: There were no vitals filed for this visit.               Zoii Florer,JAMES TERRILL

## 2016-03-24 NOTE — Transfer of Care (Signed)
Immediate Anesthesia Transfer of Care Note  Patient: Colton Gibson  Procedure(s) Performed: Procedure(s): COLONOSCOPY WITH PROPOFOL (N/A)  Patient Location: PACU  Anesthesia Type:MAC  Level of Consciousness:  sedated, patient cooperative and responds to stimulation  Airway & Oxygen Therapy:Patient Spontanous Breathing and Patient connected to face mask oxgen  Post-op Assessment:  Report given to PACU RN and Post -op Vital signs reviewed and stable  Post vital signs:  Reviewed and stable  Last Vitals:  Filed Vitals:   03/24/16 0938  BP: 134/66  Temp: 36.7 C  Resp: 15    Complications: No apparent anesthesia complications

## 2016-03-24 NOTE — Anesthesia Preprocedure Evaluation (Signed)
Anesthesia Evaluation  Patient identified by MRN, date of birth, ID band Patient awake    Reviewed: Allergy & Precautions, NPO status , Patient's Chart, lab work & pertinent test results  Airway Mallampati: I  TM Distance: >3 FB Neck ROM: Full    Dental  (+) Teeth Intact   Pulmonary neg pulmonary ROS,    breath sounds clear to auscultation       Cardiovascular hypertension,  Rhythm:Regular Rate:Normal     Neuro/Psych negative neurological ROS     GI/Hepatic negative GI ROS,   Endo/Other  diabetesHypothyroidism Morbid obesity  Renal/GU negative Renal ROS     Musculoskeletal   Abdominal (+) + obese,   Peds  Hematology   Anesthesia Other Findings   Reproductive/Obstetrics                             Anesthesia Physical Anesthesia Plan  ASA: III  Anesthesia Plan: MAC   Post-op Pain Management:    Induction: Intravenous  Airway Management Planned: Natural Airway and Simple Face Mask  Additional Equipment:   Intra-op Plan:   Post-operative Plan:   Informed Consent: I have reviewed the patients History and Physical, chart, labs and discussed the procedure including the risks, benefits and alternatives for the proposed anesthesia with the patient or authorized representative who has indicated his/her understanding and acceptance.     Plan Discussed with: CRNA and Surgeon  Anesthesia Plan Comments:         Anesthesia Quick Evaluation

## 2016-03-25 ENCOUNTER — Encounter (HOSPITAL_COMMUNITY): Payer: Self-pay | Admitting: Gastroenterology

## 2016-05-02 DIAGNOSIS — R739 Hyperglycemia, unspecified: Secondary | ICD-10-CM | POA: Diagnosis not present

## 2016-05-02 DIAGNOSIS — E890 Postprocedural hypothyroidism: Secondary | ICD-10-CM | POA: Diagnosis not present

## 2016-06-13 DIAGNOSIS — E890 Postprocedural hypothyroidism: Secondary | ICD-10-CM | POA: Diagnosis not present

## 2016-07-03 DIAGNOSIS — E291 Testicular hypofunction: Secondary | ICD-10-CM | POA: Diagnosis not present

## 2016-07-09 DIAGNOSIS — Z125 Encounter for screening for malignant neoplasm of prostate: Secondary | ICD-10-CM | POA: Diagnosis not present

## 2016-07-09 DIAGNOSIS — Z0000 Encounter for general adult medical examination without abnormal findings: Secondary | ICD-10-CM | POA: Diagnosis not present

## 2016-08-12 DIAGNOSIS — Z111 Encounter for screening for respiratory tuberculosis: Secondary | ICD-10-CM | POA: Diagnosis not present

## 2016-09-25 DIAGNOSIS — K719 Toxic liver disease, unspecified: Secondary | ICD-10-CM | POA: Diagnosis not present

## 2016-09-25 DIAGNOSIS — E291 Testicular hypofunction: Secondary | ICD-10-CM | POA: Diagnosis not present

## 2016-10-27 DIAGNOSIS — R7301 Impaired fasting glucose: Secondary | ICD-10-CM | POA: Diagnosis not present

## 2016-10-27 DIAGNOSIS — Z85850 Personal history of malignant neoplasm of thyroid: Secondary | ICD-10-CM | POA: Diagnosis not present

## 2016-10-27 DIAGNOSIS — E890 Postprocedural hypothyroidism: Secondary | ICD-10-CM | POA: Diagnosis not present

## 2016-10-29 DIAGNOSIS — Z85850 Personal history of malignant neoplasm of thyroid: Secondary | ICD-10-CM | POA: Diagnosis not present

## 2016-10-29 DIAGNOSIS — E890 Postprocedural hypothyroidism: Secondary | ICD-10-CM | POA: Diagnosis not present

## 2016-10-29 DIAGNOSIS — Z23 Encounter for immunization: Secondary | ICD-10-CM | POA: Diagnosis not present

## 2017-03-25 DIAGNOSIS — E291 Testicular hypofunction: Secondary | ICD-10-CM | POA: Diagnosis not present

## 2017-07-27 DIAGNOSIS — Z6841 Body Mass Index (BMI) 40.0 and over, adult: Secondary | ICD-10-CM | POA: Diagnosis not present

## 2017-07-27 DIAGNOSIS — E663 Overweight: Secondary | ICD-10-CM | POA: Diagnosis not present

## 2017-07-27 DIAGNOSIS — R7301 Impaired fasting glucose: Secondary | ICD-10-CM | POA: Diagnosis not present

## 2017-07-27 DIAGNOSIS — Z23 Encounter for immunization: Secondary | ICD-10-CM | POA: Diagnosis not present

## 2017-07-27 DIAGNOSIS — Z0000 Encounter for general adult medical examination without abnormal findings: Secondary | ICD-10-CM | POA: Diagnosis not present

## 2017-07-27 DIAGNOSIS — Z136 Encounter for screening for cardiovascular disorders: Secondary | ICD-10-CM | POA: Diagnosis not present

## 2017-07-27 DIAGNOSIS — E039 Hypothyroidism, unspecified: Secondary | ICD-10-CM | POA: Diagnosis not present

## 2017-07-28 DIAGNOSIS — E876 Hypokalemia: Secondary | ICD-10-CM | POA: Diagnosis not present

## 2017-07-28 DIAGNOSIS — R7989 Other specified abnormal findings of blood chemistry: Secondary | ICD-10-CM | POA: Diagnosis not present

## 2017-07-31 DIAGNOSIS — R7989 Other specified abnormal findings of blood chemistry: Secondary | ICD-10-CM | POA: Diagnosis not present

## 2017-09-14 DIAGNOSIS — R944 Abnormal results of kidney function studies: Secondary | ICD-10-CM | POA: Diagnosis not present

## 2017-09-25 DIAGNOSIS — E291 Testicular hypofunction: Secondary | ICD-10-CM | POA: Diagnosis not present

## 2017-09-25 DIAGNOSIS — K719 Toxic liver disease, unspecified: Secondary | ICD-10-CM | POA: Diagnosis not present

## 2017-10-23 DIAGNOSIS — Z85850 Personal history of malignant neoplasm of thyroid: Secondary | ICD-10-CM | POA: Diagnosis not present

## 2017-10-23 DIAGNOSIS — E890 Postprocedural hypothyroidism: Secondary | ICD-10-CM | POA: Diagnosis not present

## 2017-10-27 DIAGNOSIS — Z85850 Personal history of malignant neoplasm of thyroid: Secondary | ICD-10-CM | POA: Diagnosis not present

## 2017-10-27 DIAGNOSIS — E890 Postprocedural hypothyroidism: Secondary | ICD-10-CM | POA: Diagnosis not present

## 2017-10-27 DIAGNOSIS — E669 Obesity, unspecified: Secondary | ICD-10-CM | POA: Diagnosis not present

## 2018-01-05 DIAGNOSIS — E890 Postprocedural hypothyroidism: Secondary | ICD-10-CM | POA: Diagnosis not present

## 2018-01-05 DIAGNOSIS — E348 Other specified endocrine disorders: Secondary | ICD-10-CM | POA: Diagnosis not present

## 2018-01-05 DIAGNOSIS — F649 Gender identity disorder, unspecified: Secondary | ICD-10-CM | POA: Diagnosis not present

## 2018-01-05 DIAGNOSIS — I10 Essential (primary) hypertension: Secondary | ICD-10-CM | POA: Diagnosis not present

## 2018-01-22 DIAGNOSIS — F649 Gender identity disorder, unspecified: Secondary | ICD-10-CM | POA: Diagnosis not present

## 2018-01-22 DIAGNOSIS — I10 Essential (primary) hypertension: Secondary | ICD-10-CM | POA: Diagnosis not present

## 2018-01-22 DIAGNOSIS — R001 Bradycardia, unspecified: Secondary | ICD-10-CM | POA: Diagnosis not present

## 2018-02-19 DIAGNOSIS — E785 Hyperlipidemia, unspecified: Secondary | ICD-10-CM | POA: Diagnosis not present

## 2018-02-19 DIAGNOSIS — R001 Bradycardia, unspecified: Secondary | ICD-10-CM | POA: Diagnosis not present

## 2018-02-19 DIAGNOSIS — I10 Essential (primary) hypertension: Secondary | ICD-10-CM | POA: Diagnosis not present

## 2018-02-19 DIAGNOSIS — F649 Gender identity disorder, unspecified: Secondary | ICD-10-CM | POA: Diagnosis not present

## 2018-03-05 DIAGNOSIS — R001 Bradycardia, unspecified: Secondary | ICD-10-CM | POA: Diagnosis not present

## 2018-03-05 DIAGNOSIS — I10 Essential (primary) hypertension: Secondary | ICD-10-CM | POA: Diagnosis not present

## 2018-04-13 DIAGNOSIS — I10 Essential (primary) hypertension: Secondary | ICD-10-CM | POA: Diagnosis not present

## 2018-04-13 DIAGNOSIS — F649 Gender identity disorder, unspecified: Secondary | ICD-10-CM | POA: Diagnosis not present

## 2018-04-13 DIAGNOSIS — E348 Other specified endocrine disorders: Secondary | ICD-10-CM | POA: Diagnosis not present

## 2018-04-13 DIAGNOSIS — E785 Hyperlipidemia, unspecified: Secondary | ICD-10-CM | POA: Diagnosis not present

## 2018-05-11 DIAGNOSIS — I10 Essential (primary) hypertension: Secondary | ICD-10-CM | POA: Diagnosis not present

## 2018-05-11 DIAGNOSIS — F649 Gender identity disorder, unspecified: Secondary | ICD-10-CM | POA: Diagnosis not present

## 2018-05-11 DIAGNOSIS — E785 Hyperlipidemia, unspecified: Secondary | ICD-10-CM | POA: Diagnosis not present

## 2018-05-11 DIAGNOSIS — E348 Other specified endocrine disorders: Secondary | ICD-10-CM | POA: Diagnosis not present

## 2018-06-10 DIAGNOSIS — E348 Other specified endocrine disorders: Secondary | ICD-10-CM | POA: Diagnosis not present

## 2018-06-10 DIAGNOSIS — I10 Essential (primary) hypertension: Secondary | ICD-10-CM | POA: Diagnosis not present

## 2018-06-10 DIAGNOSIS — F649 Gender identity disorder, unspecified: Secondary | ICD-10-CM | POA: Diagnosis not present

## 2019-04-15 DIAGNOSIS — E348 Other specified endocrine disorders: Secondary | ICD-10-CM | POA: Diagnosis not present

## 2019-04-15 DIAGNOSIS — E785 Hyperlipidemia, unspecified: Secondary | ICD-10-CM | POA: Diagnosis not present

## 2019-04-15 DIAGNOSIS — F649 Gender identity disorder, unspecified: Secondary | ICD-10-CM | POA: Diagnosis not present

## 2019-04-15 DIAGNOSIS — I10 Essential (primary) hypertension: Secondary | ICD-10-CM | POA: Diagnosis not present

## 2019-05-10 DIAGNOSIS — E785 Hyperlipidemia, unspecified: Secondary | ICD-10-CM | POA: Diagnosis not present

## 2019-05-10 DIAGNOSIS — R7303 Prediabetes: Secondary | ICD-10-CM | POA: Diagnosis not present

## 2019-05-10 DIAGNOSIS — I10 Essential (primary) hypertension: Secondary | ICD-10-CM | POA: Diagnosis not present

## 2019-12-12 DIAGNOSIS — E669 Obesity, unspecified: Secondary | ICD-10-CM | POA: Diagnosis not present

## 2019-12-12 DIAGNOSIS — E890 Postprocedural hypothyroidism: Secondary | ICD-10-CM | POA: Diagnosis not present

## 2019-12-12 DIAGNOSIS — Z85850 Personal history of malignant neoplasm of thyroid: Secondary | ICD-10-CM | POA: Diagnosis not present

## 2019-12-12 DIAGNOSIS — Z7189 Other specified counseling: Secondary | ICD-10-CM | POA: Diagnosis not present

## 2019-12-22 DIAGNOSIS — E785 Hyperlipidemia, unspecified: Secondary | ICD-10-CM | POA: Diagnosis not present

## 2019-12-22 DIAGNOSIS — I10 Essential (primary) hypertension: Secondary | ICD-10-CM | POA: Diagnosis not present

## 2019-12-22 DIAGNOSIS — E348 Other specified endocrine disorders: Secondary | ICD-10-CM | POA: Diagnosis not present

## 2019-12-22 DIAGNOSIS — F649 Gender identity disorder, unspecified: Secondary | ICD-10-CM | POA: Diagnosis not present

## 2020-01-04 DIAGNOSIS — I10 Essential (primary) hypertension: Secondary | ICD-10-CM | POA: Diagnosis not present

## 2020-01-04 DIAGNOSIS — F649 Gender identity disorder, unspecified: Secondary | ICD-10-CM | POA: Diagnosis not present

## 2020-01-04 DIAGNOSIS — E785 Hyperlipidemia, unspecified: Secondary | ICD-10-CM | POA: Diagnosis not present

## 2020-01-04 DIAGNOSIS — R7303 Prediabetes: Secondary | ICD-10-CM | POA: Diagnosis not present

## 2020-01-04 DIAGNOSIS — E348 Other specified endocrine disorders: Secondary | ICD-10-CM | POA: Diagnosis not present

## 2020-02-02 DIAGNOSIS — E785 Hyperlipidemia, unspecified: Secondary | ICD-10-CM | POA: Diagnosis not present

## 2020-02-02 DIAGNOSIS — R7303 Prediabetes: Secondary | ICD-10-CM | POA: Diagnosis not present

## 2020-07-06 DIAGNOSIS — Z20822 Contact with and (suspected) exposure to covid-19: Secondary | ICD-10-CM | POA: Diagnosis not present

## 2020-10-02 DIAGNOSIS — Z23 Encounter for immunization: Secondary | ICD-10-CM | POA: Diagnosis not present

## 2020-12-14 DIAGNOSIS — E890 Postprocedural hypothyroidism: Secondary | ICD-10-CM | POA: Diagnosis not present

## 2020-12-14 DIAGNOSIS — E349 Endocrine disorder, unspecified: Secondary | ICD-10-CM | POA: Diagnosis not present

## 2020-12-14 DIAGNOSIS — E785 Hyperlipidemia, unspecified: Secondary | ICD-10-CM | POA: Diagnosis not present

## 2020-12-14 DIAGNOSIS — I10 Essential (primary) hypertension: Secondary | ICD-10-CM | POA: Diagnosis not present

## 2020-12-14 DIAGNOSIS — R7303 Prediabetes: Secondary | ICD-10-CM | POA: Diagnosis not present

## 2020-12-14 DIAGNOSIS — F649 Gender identity disorder, unspecified: Secondary | ICD-10-CM | POA: Diagnosis not present

## 2021-05-21 DIAGNOSIS — I10 Essential (primary) hypertension: Secondary | ICD-10-CM | POA: Diagnosis not present

## 2021-05-21 DIAGNOSIS — R7303 Prediabetes: Secondary | ICD-10-CM | POA: Diagnosis not present

## 2021-05-21 DIAGNOSIS — E348 Other specified endocrine disorders: Secondary | ICD-10-CM | POA: Diagnosis not present

## 2021-05-21 DIAGNOSIS — E785 Hyperlipidemia, unspecified: Secondary | ICD-10-CM | POA: Diagnosis not present

## 2021-07-19 DIAGNOSIS — E348 Other specified endocrine disorders: Secondary | ICD-10-CM | POA: Diagnosis not present

## 2021-07-19 DIAGNOSIS — R7303 Prediabetes: Secondary | ICD-10-CM | POA: Diagnosis not present

## 2021-07-19 DIAGNOSIS — E782 Mixed hyperlipidemia: Secondary | ICD-10-CM | POA: Diagnosis not present

## 2021-08-02 DIAGNOSIS — H02825 Cysts of left lower eyelid: Secondary | ICD-10-CM | POA: Diagnosis not present

## 2021-08-02 DIAGNOSIS — H40013 Open angle with borderline findings, low risk, bilateral: Secondary | ICD-10-CM | POA: Diagnosis not present

## 2021-08-02 DIAGNOSIS — H25813 Combined forms of age-related cataract, bilateral: Secondary | ICD-10-CM | POA: Diagnosis not present

## 2021-08-02 DIAGNOSIS — H35033 Hypertensive retinopathy, bilateral: Secondary | ICD-10-CM | POA: Diagnosis not present

## 2021-08-14 DIAGNOSIS — H02825 Cysts of left lower eyelid: Secondary | ICD-10-CM | POA: Diagnosis not present

## 2021-08-14 DIAGNOSIS — D23122 Other benign neoplasm of skin of left lower eyelid, including canthus: Secondary | ICD-10-CM | POA: Diagnosis not present

## 2021-08-26 DIAGNOSIS — H35031 Hypertensive retinopathy, right eye: Secondary | ICD-10-CM | POA: Diagnosis not present

## 2021-08-26 DIAGNOSIS — E890 Postprocedural hypothyroidism: Secondary | ICD-10-CM | POA: Diagnosis not present

## 2021-08-26 DIAGNOSIS — I10 Essential (primary) hypertension: Secondary | ICD-10-CM | POA: Diagnosis not present

## 2021-08-26 DIAGNOSIS — E348 Other specified endocrine disorders: Secondary | ICD-10-CM | POA: Diagnosis not present

## 2021-08-26 DIAGNOSIS — Z23 Encounter for immunization: Secondary | ICD-10-CM | POA: Diagnosis not present

## 2021-08-26 DIAGNOSIS — E349 Endocrine disorder, unspecified: Secondary | ICD-10-CM | POA: Diagnosis not present

## 2021-08-26 DIAGNOSIS — D649 Anemia, unspecified: Secondary | ICD-10-CM | POA: Diagnosis not present

## 2021-08-26 DIAGNOSIS — E782 Mixed hyperlipidemia: Secondary | ICD-10-CM | POA: Diagnosis not present

## 2021-09-02 DIAGNOSIS — E611 Iron deficiency: Secondary | ICD-10-CM | POA: Diagnosis not present

## 2021-09-02 DIAGNOSIS — D649 Anemia, unspecified: Secondary | ICD-10-CM | POA: Diagnosis not present

## 2021-09-02 DIAGNOSIS — E890 Postprocedural hypothyroidism: Secondary | ICD-10-CM | POA: Diagnosis not present

## 2021-09-02 DIAGNOSIS — E349 Endocrine disorder, unspecified: Secondary | ICD-10-CM | POA: Diagnosis not present

## 2021-10-17 DIAGNOSIS — E349 Endocrine disorder, unspecified: Secondary | ICD-10-CM | POA: Diagnosis not present

## 2021-10-17 DIAGNOSIS — E611 Iron deficiency: Secondary | ICD-10-CM | POA: Diagnosis not present

## 2021-10-17 DIAGNOSIS — E890 Postprocedural hypothyroidism: Secondary | ICD-10-CM | POA: Diagnosis not present

## 2021-10-25 DIAGNOSIS — D649 Anemia, unspecified: Secondary | ICD-10-CM | POA: Diagnosis not present

## 2021-10-25 DIAGNOSIS — E349 Endocrine disorder, unspecified: Secondary | ICD-10-CM | POA: Diagnosis not present
